# Patient Record
Sex: Male | Born: 1980 | Hispanic: Yes | Marital: Single | Smoking: Current every day smoker
Health system: Southern US, Community
[De-identification: ages and names within clinical notes are randomized; demographics above are authoritative.]

---

## 2014-01-22 ENCOUNTER — Encounter (HOSPITAL_COMMUNITY): Payer: Self-pay | Admitting: Emergency Medicine

## 2014-01-22 ENCOUNTER — Emergency Department (HOSPITAL_COMMUNITY)
Admission: EM | Admit: 2014-01-22 | Discharge: 2014-01-22 | Disposition: A | Discharge status: Home / Self Care | Payer: No Typology Code available for payment source | Attending: Emergency Medicine | Admitting: Emergency Medicine

## 2014-01-22 ENCOUNTER — Emergency Department (HOSPITAL_COMMUNITY): Payer: No Typology Code available for payment source

## 2014-01-22 DIAGNOSIS — M25511 Pain in right shoulder: Secondary | ICD-10-CM | POA: Insufficient documentation

## 2014-01-22 DIAGNOSIS — Y9389 Activity, other specified: Secondary | ICD-10-CM | POA: Diagnosis not present

## 2014-01-22 DIAGNOSIS — Z720 Tobacco use: Secondary | ICD-10-CM | POA: Diagnosis not present

## 2014-01-22 DIAGNOSIS — M25521 Pain in right elbow: Secondary | ICD-10-CM | POA: Diagnosis not present

## 2014-01-22 DIAGNOSIS — Y92410 Unspecified street and highway as the place of occurrence of the external cause: Secondary | ICD-10-CM | POA: Diagnosis not present

## 2014-01-22 DIAGNOSIS — M79601 Pain in right arm: Secondary | ICD-10-CM

## 2014-01-22 MED ORDER — METHOCARBAMOL 500 MG PO TABS: 500.0000 mg | ORAL_TABLET | Freq: Two times a day (BID) | ORAL | Status: DC

## 2014-01-22 MED ORDER — HYDROCODONE-ACETAMINOPHEN 5-325 MG PO TABS: 1.0000 | ORAL_TABLET | ORAL | Status: DC | PRN
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
## 2014-01-22 NOTE — Discharge Instructions (Signed)
Take the prescribed medication as directed.  Use caution when driving, vicodin may make you drowsy. Follow-up with [facility] if symptoms not improving in the next few days. Return to the ED for new or worsening symptoms.

## 2014-01-23 NOTE — ED Provider Notes (Signed)
Medical screening examination/treatment/procedure(s) were performed by non-physician practitioner and as supervising physician I was immediately available for consultation/collaboration.   EKG Interpretation None        Kendal Ghazarian, DO 01/23/14 1619 

## 2014-01-26 ENCOUNTER — Emergency Department (HOSPITAL_COMMUNITY): Payer: No Typology Code available for payment source

## 2014-01-26 ENCOUNTER — Encounter (HOSPITAL_COMMUNITY): Payer: Self-pay | Admitting: Emergency Medicine

## 2014-01-26 ENCOUNTER — Emergency Department (HOSPITAL_COMMUNITY)
Admission: EM | Admit: 2014-01-26 | Discharge: 2014-01-26 | Disposition: A | Discharge status: Home / Self Care | Payer: No Typology Code available for payment source | Attending: Emergency Medicine | Admitting: Emergency Medicine

## 2014-01-26 DIAGNOSIS — Z79899 Other long term (current) drug therapy: Secondary | ICD-10-CM | POA: Insufficient documentation

## 2014-01-26 DIAGNOSIS — Y92410 Unspecified street and highway as the place of occurrence of the external cause: Secondary | ICD-10-CM | POA: Diagnosis not present

## 2014-01-26 DIAGNOSIS — Y9389 Activity, other specified: Secondary | ICD-10-CM | POA: Insufficient documentation

## 2014-01-26 DIAGNOSIS — M79601 Pain in right arm: Secondary | ICD-10-CM

## 2014-01-26 DIAGNOSIS — S4991XA Unspecified injury of right shoulder and upper arm, initial encounter: Secondary | ICD-10-CM | POA: Insufficient documentation

## 2014-01-26 DIAGNOSIS — Z720 Tobacco use: Secondary | ICD-10-CM | POA: Insufficient documentation

## 2014-01-26 DIAGNOSIS — S59901A Unspecified injury of right elbow, initial encounter: Secondary | ICD-10-CM | POA: Diagnosis not present

## 2014-01-26 DIAGNOSIS — S335XXA Sprain of ligaments of lumbar spine, initial encounter: Secondary | ICD-10-CM | POA: Insufficient documentation

## 2014-01-26 DIAGNOSIS — S39012A Strain of muscle, fascia and tendon of lower back, initial encounter: Secondary | ICD-10-CM

## 2014-01-26 MED ORDER — CYCLOBENZAPRINE HCL 10 MG PO TABS: 10.0000 mg | ORAL_TABLET | Freq: Two times a day (BID) | ORAL | Status: AC | PRN

## 2014-01-26 MED ORDER — NAPROXEN 500 MG PO TABS: 500.0000 mg | ORAL_TABLET | Freq: Two times a day (BID) | ORAL | Status: AC

## 2014-01-26 MED ORDER — TRAMADOL HCL 50 MG PO TABS: 50.0000 mg | ORAL_TABLET | Freq: Four times a day (QID) | ORAL | Status: AC | PRN
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
## 2014-01-26 NOTE — ED Notes (Signed)
Declined W/C at D/C and was escorted to lobby by RN. 

## 2014-01-27 NOTE — ED Provider Notes (Signed)
Medical screening examination/treatment/procedure(s) were performed by non-physician practitioner and as supervising physician I was immediately available for consultation/collaboration.    Gal Smolinski D Hazim Treadway, MD 01/27/14 1029 

## 2015-06-24 ENCOUNTER — Telehealth: Payer: Self-pay | Admitting: Pediatrics

## 2015-06-24 NOTE — Telephone Encounter (Signed)
Opened in error
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
